# Patient Record
Sex: Female | Born: 1970 | Race: Black or African American | Hispanic: No | Marital: Married | Smoking: Never smoker
Health system: Southern US, Community
[De-identification: ages and names within clinical notes are randomized; demographics above are authoritative.]

## PROBLEM LIST (undated history)

## (undated) DIAGNOSIS — I10 Essential (primary) hypertension: Secondary | ICD-10-CM

---

## 2013-07-03 ENCOUNTER — Encounter (HOSPITAL_COMMUNITY): Payer: Self-pay | Admitting: Emergency Medicine

## 2013-07-03 ENCOUNTER — Emergency Department (HOSPITAL_COMMUNITY): Payer: Self-pay

## 2013-07-03 ENCOUNTER — Emergency Department (HOSPITAL_COMMUNITY)
Admission: EM | Admit: 2013-07-03 | Discharge: 2013-07-03 | Disposition: A | Discharge status: Home / Self Care | Payer: Self-pay | Attending: Emergency Medicine | Admitting: Emergency Medicine

## 2013-07-03 DIAGNOSIS — R000 Tachycardia, unspecified: Secondary | ICD-10-CM | POA: Insufficient documentation

## 2013-07-03 DIAGNOSIS — M2550 Pain in unspecified joint: Secondary | ICD-10-CM | POA: Insufficient documentation

## 2013-07-03 DIAGNOSIS — R52 Pain, unspecified: Secondary | ICD-10-CM | POA: Insufficient documentation

## 2013-07-03 DIAGNOSIS — Z79899 Other long term (current) drug therapy: Secondary | ICD-10-CM | POA: Insufficient documentation

## 2013-07-03 DIAGNOSIS — I10 Essential (primary) hypertension: Secondary | ICD-10-CM | POA: Insufficient documentation

## 2013-07-03 DIAGNOSIS — R509 Fever, unspecified: Secondary | ICD-10-CM

## 2013-07-03 DIAGNOSIS — J159 Unspecified bacterial pneumonia: Secondary | ICD-10-CM | POA: Insufficient documentation

## 2013-07-03 DIAGNOSIS — J189 Pneumonia, unspecified organism: Secondary | ICD-10-CM

## 2013-07-03 DIAGNOSIS — Z3202 Encounter for pregnancy test, result negative: Secondary | ICD-10-CM | POA: Insufficient documentation

## 2013-07-03 HISTORY — DX: Essential (primary) hypertension: I10

## 2013-07-03 LAB — CBC WITH DIFFERENTIAL/PLATELET
BASOS ABS: 0 10*3/uL (ref 0.0–0.1)
Basophils Relative: 0 % (ref 0–1)
EOS PCT: 1 % (ref 0–5)
Eosinophils Absolute: 0.1 10*3/uL (ref 0.0–0.7)
HEMATOCRIT: 41.6 % (ref 36.0–46.0)
HEMOGLOBIN: 13.8 g/dL (ref 12.0–15.0)
Lymphocytes Relative: 11 % — ABNORMAL LOW (ref 12–46)
Lymphs Abs: 0.8 10*3/uL (ref 0.7–4.0)
MCH: 26.6 pg (ref 26.0–34.0)
MCHC: 33.2 g/dL (ref 30.0–36.0)
MCV: 80.2 fL (ref 78.0–100.0)
MONO ABS: 0.3 10*3/uL (ref 0.1–1.0)
MONOS PCT: 5 % (ref 3–12)
Neutro Abs: 6.4 10*3/uL (ref 1.7–7.7)
Neutrophils Relative %: 84 % — ABNORMAL HIGH (ref 43–77)
Platelets: 335 10*3/uL (ref 150–400)
RBC: 5.19 MIL/uL — ABNORMAL HIGH (ref 3.87–5.11)
RDW: 13.8 % (ref 11.5–15.5)
WBC: 7.6 10*3/uL (ref 4.0–10.5)

## 2013-07-03 LAB — BASIC METABOLIC PANEL
BUN: 12 mg/dL (ref 6–23)
CALCIUM: 9.9 mg/dL (ref 8.4–10.5)
CO2: 22 meq/L (ref 19–32)
CREATININE: 0.8 mg/dL (ref 0.50–1.10)
Chloride: 103 mEq/L (ref 96–112)
GFR calc Af Amer: >90 mL/min (ref >90)
GFR calc non Af Amer: 88 mL/min — ABNORMAL LOW (ref >90)
Glucose, Bld: 104 mg/dL — ABNORMAL HIGH (ref 70–99)
Potassium: 4.2 mEq/L (ref 3.7–5.3)
Sodium: 142 mEq/L (ref 137–147)

## 2013-07-03 LAB — URINALYSIS, ROUTINE W REFLEX MICROSCOPIC
BILIRUBIN URINE: NEGATIVE
Glucose, UA: NEGATIVE mg/dL
HGB URINE DIPSTICK: NEGATIVE
Ketones, ur: NEGATIVE mg/dL
Leukocytes, UA: NEGATIVE
Nitrite: NEGATIVE
Protein, ur: NEGATIVE mg/dL
Specific Gravity, Urine: 1.007 (ref 1.005–1.030)
UROBILINOGEN UA: 1 mg/dL (ref 0.0–1.0)
pH: 7.5 (ref 5.0–8.0)

## 2013-07-03 LAB — POCT I-STAT TROPONIN I: TROPONIN I, POC: 0.01 ng/mL (ref 0.00–0.08)

## 2013-07-03 LAB — PREGNANCY, URINE: PREG TEST UR: NEGATIVE

## 2013-07-03 MED ORDER — MORPHINE SULFATE 4 MG/ML IJ SOLN
4.0000 mg | Freq: Once | INTRAMUSCULAR | Status: AC
  Administered 2013-07-03: 4 mg via INTRAVENOUS
  Filled 2013-07-03: qty 1

## 2013-07-03 MED ORDER — BENZONATATE 100 MG PO CAPS: 100.0000 mg | ORAL_CAPSULE | Freq: Three times a day (TID) | ORAL | Status: AC

## 2013-07-03 MED ORDER — HYDROCODONE-ACETAMINOPHEN 5-325 MG PO TABS
1.0000 | ORAL_TABLET | Freq: Once | ORAL | Status: AC
  Administered 2013-07-03: 1 via ORAL
  Filled 2013-07-03: qty 1

## 2013-07-03 MED ORDER — ALBUTEROL SULFATE HFA 108 (90 BASE) MCG/ACT IN AERS
1.0000 | INHALATION_SPRAY | Freq: Four times a day (QID) | RESPIRATORY_TRACT | Status: AC | PRN
Start: 2013-07-03 — End: ?

## 2013-07-03 MED ORDER — GUAIFENESIN 100 MG/5ML PO SOLN
5.0000 mL | Freq: Once | ORAL | Status: AC
  Administered 2013-07-03: 100 mg via ORAL
  Filled 2013-07-03: qty 5

## 2013-07-03 MED ORDER — ACETAMINOPHEN 500 MG PO TABS
1000.0000 mg | ORAL_TABLET | Freq: Once | ORAL | Status: AC
  Administered 2013-07-03: 1000 mg via ORAL
  Filled 2013-07-03: qty 2

## 2013-07-03 MED ORDER — HYDROCODONE-ACETAMINOPHEN 5-325 MG PO TABS
1.0000 | ORAL_TABLET | Freq: Four times a day (QID) | ORAL | Status: AC | PRN
Start: 2013-07-03 — End: ?

## 2013-07-03 MED ORDER — AZITHROMYCIN 250 MG PO TABS: 250.0000 mg | ORAL_TABLET | Freq: Every day | ORAL | Status: AC

## 2013-07-03 MED ORDER — SODIUM CHLORIDE 0.9 % IV BOLUS (SEPSIS)
2000.0000 mL | Freq: Once | INTRAVENOUS | Status: AC
  Administered 2013-07-03: 2000 mL via INTRAVENOUS

## 2013-07-03 NOTE — ED Notes (Signed)
Patient ate a [place] sandwich meal and drank a ginger ale. Tolerated both. States she feels better.

## 2013-07-03 NOTE — ED Notes (Signed)
Pt presents to department for evaluation chest pain, cough, and soreness to entire body. Onset x1 day ago. Pt crying in triage. Respirations unlabored. Pt is alert and oriented x4.

## 2013-07-03 NOTE — ED Notes (Signed)
Patient transported to X-ray 

## 2013-07-03 NOTE — ED Provider Notes (Signed)
CSN: [id number]     Arrival date & time 07/03/13  1240 History   First MD Initiated Contact with Patient 07/03/13 1458     Chief Complaint  Patient presents with  . Chest Pain  . Cough  . Shortness of Breath   (Consider location/radiation/quality/duration/timing/severity/associated sxs/prior Treatment) HPI Comments: 43 yo AA female presents with cc of cough, "full body aches", and weakness.  Onset 1 day ago.  Pt has sick contacts at work.  Pt denies recent travel.    Pt tried otc robitussin with no help.  NKDA Meds: robitussin, one high blood pressure Rx PMH: - HTN PSH: TAH SHx: no smoking, drinking or drug use  Patient is a 43 y.o. female presenting with chest pain, cough, and shortness of breath. The history is provided by the patient.  Chest Pain Pain location:  Unable to specify (diffuse with cough only) Pain quality: aching   Pain radiates to:  Does not radiate Pain radiates to the back: no   Pain severity:  Moderate Onset quality:  Gradual Duration:  1 day Timing:  Intermittent Relieved by:  None tried Worsened by:  Nothing tried Ineffective treatments: robitussin. Associated symptoms: cough, fatigue, fever and shortness of breath   Associated symptoms: no diaphoresis, no dysphagia, no headache, no heartburn and no palpitations   Associated symptoms comment:  Subjective fever Cough:    Cough characteristics:  Productive   Sputum characteristics:  Nondescript   Severity:  Moderate   Onset quality:  Gradual   Duration:  1 day   Progression:  Waxing and waning   Chronicity:  New Cough Associated symptoms: chest pain, fever and shortness of breath   Associated symptoms: no chills, no diaphoresis, no headaches and no wheezing   Shortness of Breath Associated symptoms: chest pain, cough and fever   Associated symptoms: no diaphoresis, no headaches, no neck pain and no wheezing     Past Medical History  Diagnosis Date  . Hypertension    History reviewed. No  pertinent past surgical history. No family history on file. History  Substance Use Topics  . Smoking status: Never Smoker   . Smokeless tobacco: Not on file  . Alcohol Use: No   OB History   Grav Para Term Preterm Abortions TAB SAB Ect Mult Living                 Review of Systems  Constitutional: Positive for fever and fatigue. Negative for chills, diaphoresis, activity change, appetite change and unexpected weight change.  HENT: Negative.  Negative for trouble swallowing.   Eyes: Negative.   Respiratory: Positive for cough and shortness of breath. Negative for apnea, choking, chest tightness, wheezing and stridor.   Cardiovascular: Positive for chest pain. Negative for palpitations and leg swelling.  Gastrointestinal: Negative.  Negative for heartburn.  Endocrine: Negative.   Genitourinary: Negative.   Musculoskeletal: Positive for arthralgias. Negative for neck pain and neck stiffness.       Myalgias  Skin: Negative.   Neurological: Negative for headaches.    Allergies  Review of patient's allergies indicates no known allergies.  Home Medications   Current Outpatient Rx  Name  Route  Sig  Dispense  Refill  . guaiFENesin (ROBITUSSIN) 100 MG/5ML liquid   Oral   Take 200 mg by mouth 3 (three) times daily as needed for cough.         . hydrochlorothiazide (HYDRODIURIL) 25 MG tablet   Oral   Take 25 mg by mouth daily.         Marland Kitchen  albuterol (PROVENTIL HFA;VENTOLIN HFA) 108 (90 BASE) MCG/ACT inhaler   Inhalation   Inhale 1-2 puffs into the lungs every 6 (six) hours as needed for wheezing or shortness of breath.   1 Inhaler   0   . azithromycin (ZITHROMAX) 250 MG tablet   Oral   Take 1 tablet (250 mg total) by mouth daily. Take first 2 tablets together, then 1 every day until finished.   6 tablet   0   . benzonatate (TESSALON) 100 MG capsule   Oral   Take 1 capsule (100 mg total) by mouth every 8 (eight) hours.   21 capsule   0   . HYDROcodone-acetaminophen  (NORCO/VICODIN) 5-325 MG per tablet   Oral   Take 1 tablet by mouth every 6 (six) hours as needed.   6 tablet   0    BP 114/67  Pulse 113  Temp(Src) 99.1 F (37.3 C) (Oral)  Resp 16  SpO2 98% Physical Exam  Nursing note and vitals reviewed. Constitutional: She is oriented to person, place, and time. She appears well-developed and well-nourished.  Patient is laying in the fetal position, very soft-spoken, but alert and oriented and in no acute distress.  HENT:  Head: Normocephalic and atraumatic.  Right Ear: External ear normal.  Left Ear: External ear normal.  Nose: Nose normal.  Mouth/Throat: Oropharynx is clear and moist.  Eyes: Conjunctivae are normal. Right eye exhibits no discharge. Left eye exhibits no discharge.  Neck: Normal range of motion. Neck supple. No JVD present.  No nuchal rigidity, no meningismus, full active range of motion.  Cardiovascular: Regular rhythm, normal heart sounds and intact distal pulses.  Exam reveals no friction rub.   No murmur heard. Tachycardic in 120s.  Pulmonary/Chest: Effort normal. No stridor. No respiratory distress. She has no wheezes. She has no rales. She exhibits no tenderness.  Abdominal: Soft. Bowel sounds are normal. She exhibits no distension and no mass. There is no tenderness. There is no rebound and no guarding.  Musculoskeletal: Normal range of motion. She exhibits no edema and no tenderness.  Neurological: She is alert and oriented to person, place, and time. She has normal reflexes. No cranial nerve deficit. She exhibits normal muscle tone.  Skin: Skin is warm and dry.    ED Course  Procedures (including critical care time) Labs Review Labs Reviewed  CBC WITH DIFFERENTIAL - Abnormal; Notable for the following:    RBC 5.19 (*)    Neutrophils Relative % 84 (*)    Lymphocytes Relative 11 (*)    All other components within normal limits  BASIC METABOLIC PANEL - Abnormal; Notable for the following:    Glucose, Bld 104  (*)    GFR calc non Af Amer 88 (*)    All other components within normal limits  URINALYSIS, ROUTINE W REFLEX MICROSCOPIC  PREGNANCY, URINE  POCT I-STAT TROPONIN I   Imaging Review Dg Chest 2 View  07/03/2013   CLINICAL DATA:  Cough and congestion for 1 day. History of hypertension. Ex-smoker.  EXAM: CHEST  2 VIEW  COMPARISON:  None.  FINDINGS: The heart size and mediastinal contours are normal. There is mild atelectasis at both lung bases. No edema, confluent airspace opacity or significant pleural effusion is seen. There is occult spinal dysraphism at T1. No acute osseous findings are evident.  IMPRESSION: Mild bibasilar atelectasis.  No edema or confluent airspace opacity.   Electronically Signed   By: Roxy Horseman M.D.   On: 07/03/2013 16:18  EKG Interpretation    Date/Time:  Sunday July 03 2013 12:45:01 EST Ventricular Rate:  133 PR Interval:  150 QRS Duration: 70 QT Interval:  286 QTC Calculation: 425 R Axis:   38 Text Interpretation:  Sinus tachycardia Otherwise normal ECG Confirmed by MASNERI  MD, DAVID ([id number]) on 07/03/2013 3:06:47 PM            MDM   1. Fever   2. Community acquired pneumonia    43 year old African American female presents emergency department with one day history of cough, diffuse myalgias, chest pain only with cough, and generalized malaise. Patient is otherwise healthy. She states she has had sick contacts at work, but denies recent travel.  Triage initiated protocol reveals CBC, BMP, trop which are unremarkable. EKG reveals only sinus tachycardia.  8:21 PM ER course: Prolonged ER stay awaiting IV fluids and reassessment.  Patient subjectively and objectively feeling better. Heart rate improved. She feels much better sitting up, alert and oriented, no distress, ate a sandwich, and is ready to go home. Chest x-ray read as negative by radiology however I think there may be consolidation in the right hemithorax. Urinalysis negative, pregnancy  negative, troponin negative, CBC negative, David metabolic panel negative.  Plan to discharge with symptomatic treatment patient should take Tylenol and Motrin, pain medication given for assistance with cough and for severe pain, inhaler, antibiotics, and Tessalon Perles. She should followup with her primary care provider within the next few days. ER precautions were given. The patient and her husband agree with plan.    Darlys Galesavid Masneri, MD 07/03/13 2024

## 2013-07-03 NOTE — ED Notes (Signed)
Patient returned from X-ray 

## 2013-07-03 NOTE — Discharge Instructions (Signed)
Fever, Adult A fever is a higher than normal body temperature. In an adult, an oral temperature around 98.6 F (37 C) is considered normal. A temperature of 100.4 F (38 C) or higher is generally considered a fever. Mild or moderate fevers generally have no long-term effects and often do not require treatment. Extreme fever (greater than or equal to 106 F or 41.1 C) can cause seizures. The sweating that may occur with repeated or prolonged fever may cause dehydration. Elderly people can develop confusion during a fever. A measured temperature can vary with:  Age.  Time of day.  Method of measurement (mouth, underarm, rectal, or ear). The fever is confirmed by taking a temperature with a thermometer. Temperatures can be taken different ways. Some methods are accurate and some are not.  An oral temperature is used most commonly. Electronic thermometers are fast and accurate.  An ear temperature will only be accurate if the thermometer is positioned as recommended by the manufacturer.  A rectal temperature is accurate and done for those adults who have a condition where an oral temperature cannot be taken.  An underarm (axillary) temperature is not accurate and not recommended. Fever is a symptom, not a disease.  CAUSES   Infections commonly cause fever.  Some noninfectious causes for fever include:  Some arthritis conditions.  Some thyroid or adrenal gland conditions.  Some immune system conditions.  Some types of cancer.  A medicine reaction.  High doses of certain street drugs such as methamphetamine.  Dehydration.  Exposure to high outside or room temperatures.  Occasionally, the source of a fever cannot be determined. This is sometimes called a "fever of unknown origin" (FUO).  Some situations may lead to a temporary rise in body temperature that may go away on its own. Examples are:  Childbirth.  Surgery.  Intense exercise. HOME CARE INSTRUCTIONS   Take  appropriate medicines for fever. Follow dosing instructions carefully. If you use acetaminophen to reduce the fever, be careful to avoid taking other medicines that also contain acetaminophen. Do not take aspirin for a fever if you are younger than age 59. There is an association with Reye's syndrome. Reye's syndrome is a rare but potentially deadly disease.  If an infection is present and antibiotics have been prescribed, take them as directed. Finish them even if you start to feel better.  Rest as needed.  Maintain an adequate fluid intake. To prevent dehydration during an illness with prolonged or recurrent fever, you may need to drink extra fluid.Drink enough fluids to keep your urine clear or pale yellow.  Sponging or bathing with room temperature water may help reduce body temperature. Do not use ice water or alcohol sponge baths.  Dress comfortably, but do not over-bundle. SEEK MEDICAL CARE IF:   You are unable to keep fluids down.  You develop vomiting or diarrhea.  You are not feeling at least partly better after 3 days.  You develop new symptoms or problems. SEEK IMMEDIATE MEDICAL CARE IF:   You have shortness of breath or trouble breathing.  You develop excessive weakness.  You are dizzy or you faint.  You are extremely thirsty or you are making little or no urine.  You develop new pain that was not there before (such as in the head, neck, chest, back, or abdomen).  You have persistant vomiting and diarrhea for more than 1 to 2 days.  You develop a stiff neck or your eyes become sensitive to light.  You develop a  skin rash.  You have a fever or persistent symptoms for more than 2 to 3 days.  You have a fever and your symptoms suddenly get worse. MAKE SURE YOU:   Understand these instructions.  Will watch your condition.  Will get help right away if you are not doing well or get worse. Document Released: 11/05/2000 Document Revised: 08/04/2011 Document  Reviewed: 03/13/2011 [facility]Exit[facility] Patient Information 2014 [place], [place].  Pneumonia, Adult Pneumonia is an infection of the lungs. It may be caused by a germ (virus or bacteria). Some types of pneumonia can spread easily from person to person. This can happen when you cough or sneeze. HOME CARE  Only take medicine as told by your doctor.  Take your medicine (antibiotics) as told. Finish it even if you start to feel better.  Do not smoke.  You may use a vaporizer or humidifier in your room. This can help loosen thick spit (mucus).  Sleep so you are almost sitting up (semi-upright). This helps reduce coughing.  Rest. A shot (vaccine) can help prevent pneumonia. Shots are often advised for:  People over 423 years old.  Patients on chemotherapy.  People with long-term (chronic) lung problems.  People with immune system problems. GET HELP RIGHT AWAY IF:   You are getting worse.  You cannot control your cough, and you are losing sleep.  You cough up blood.  Your pain gets worse, even with medicine.  You have a fever.  Any of your problems are getting worse, not better.  You have shortness of breath or chest pain. MAKE SURE YOU:   Understand these instructions.  Will watch your condition.  Will get help right away if you are not doing well or get worse. Document Released: 10/29/2007 Document Revised: 08/04/2011 Document Reviewed: 08/02/2010 [facility]Exit[facility] Patient Information 2014 [place], [place].

## 2014-08-14 IMAGING — CR DG CHEST 2V
2 series · 2 of 2 positions shown · non-contrast
Comparison: None.

CLINICAL DATA: Cough and congestion for 1 day. History of
hypertension. Ex-smoker.

EXAM:
CHEST  2 VIEW

[w chest lat]
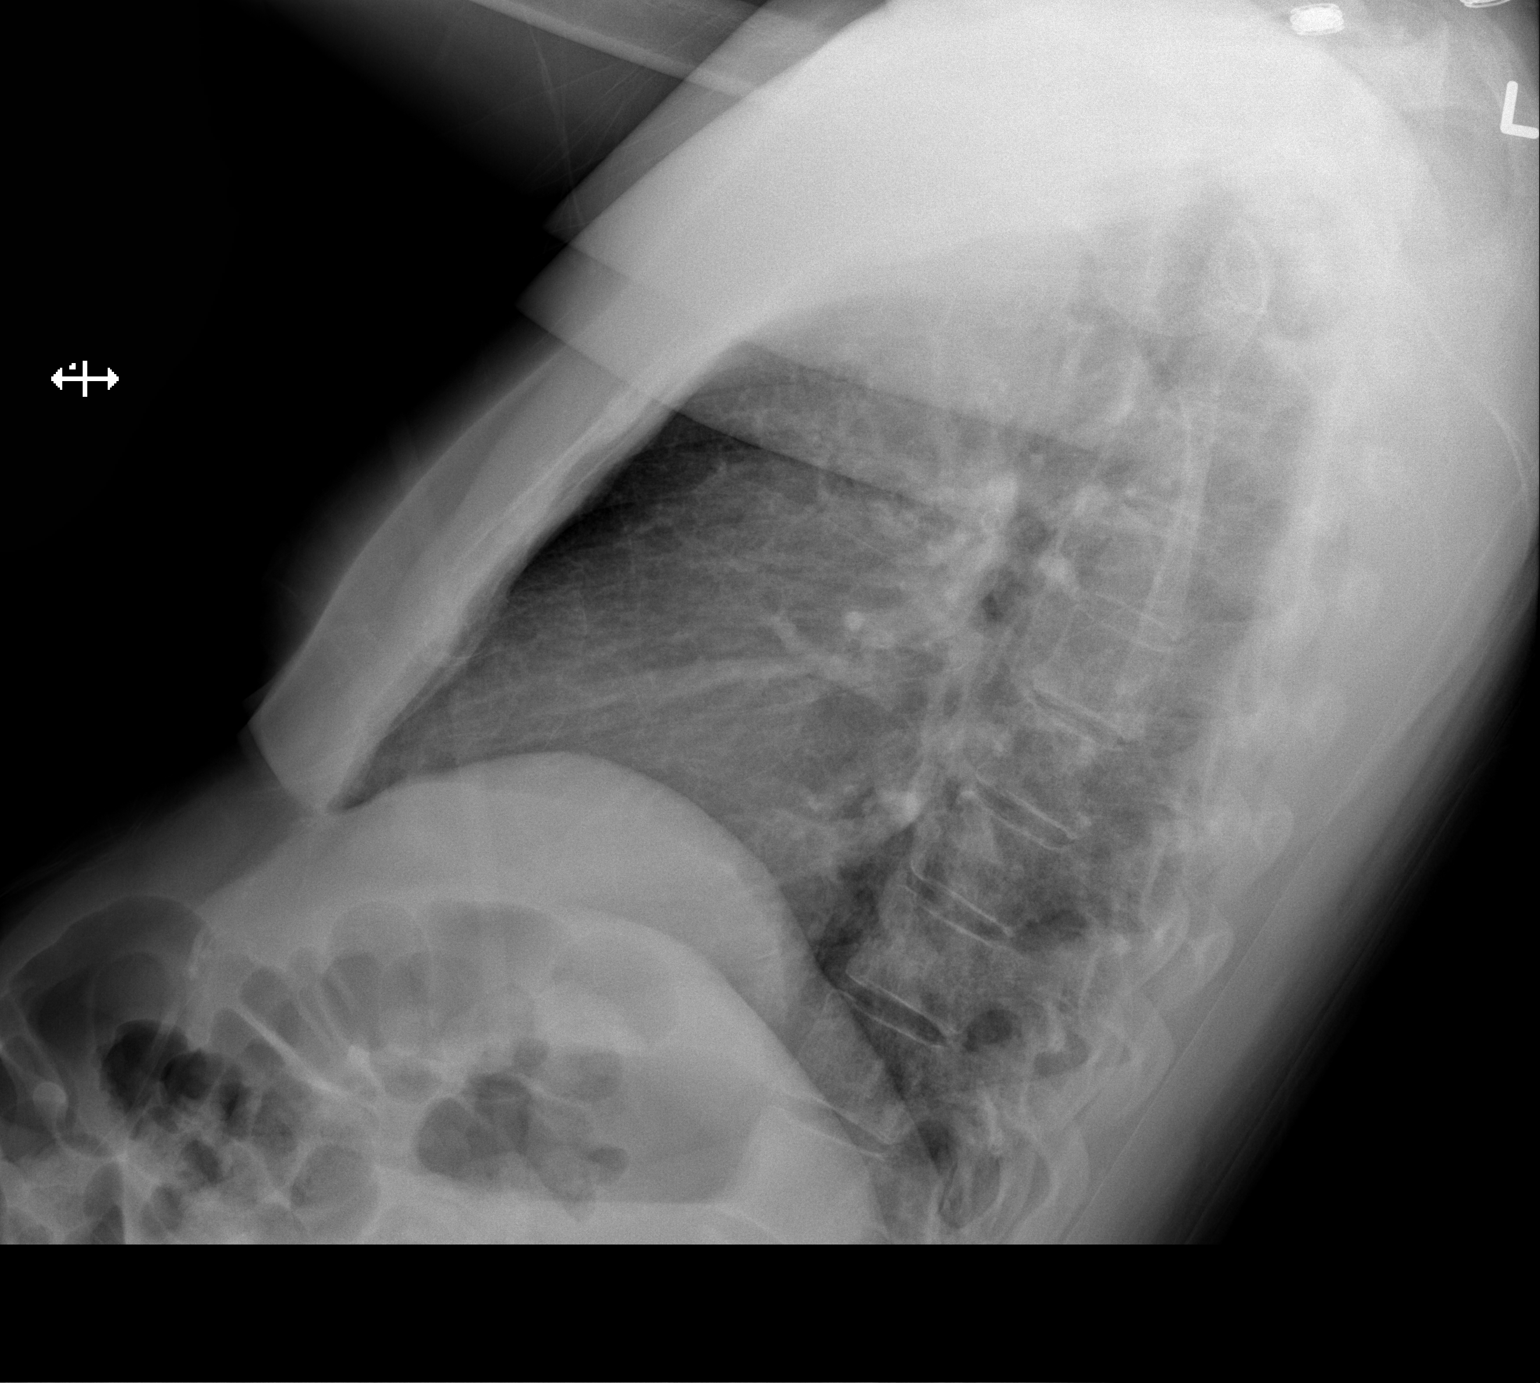

[x chest ap]
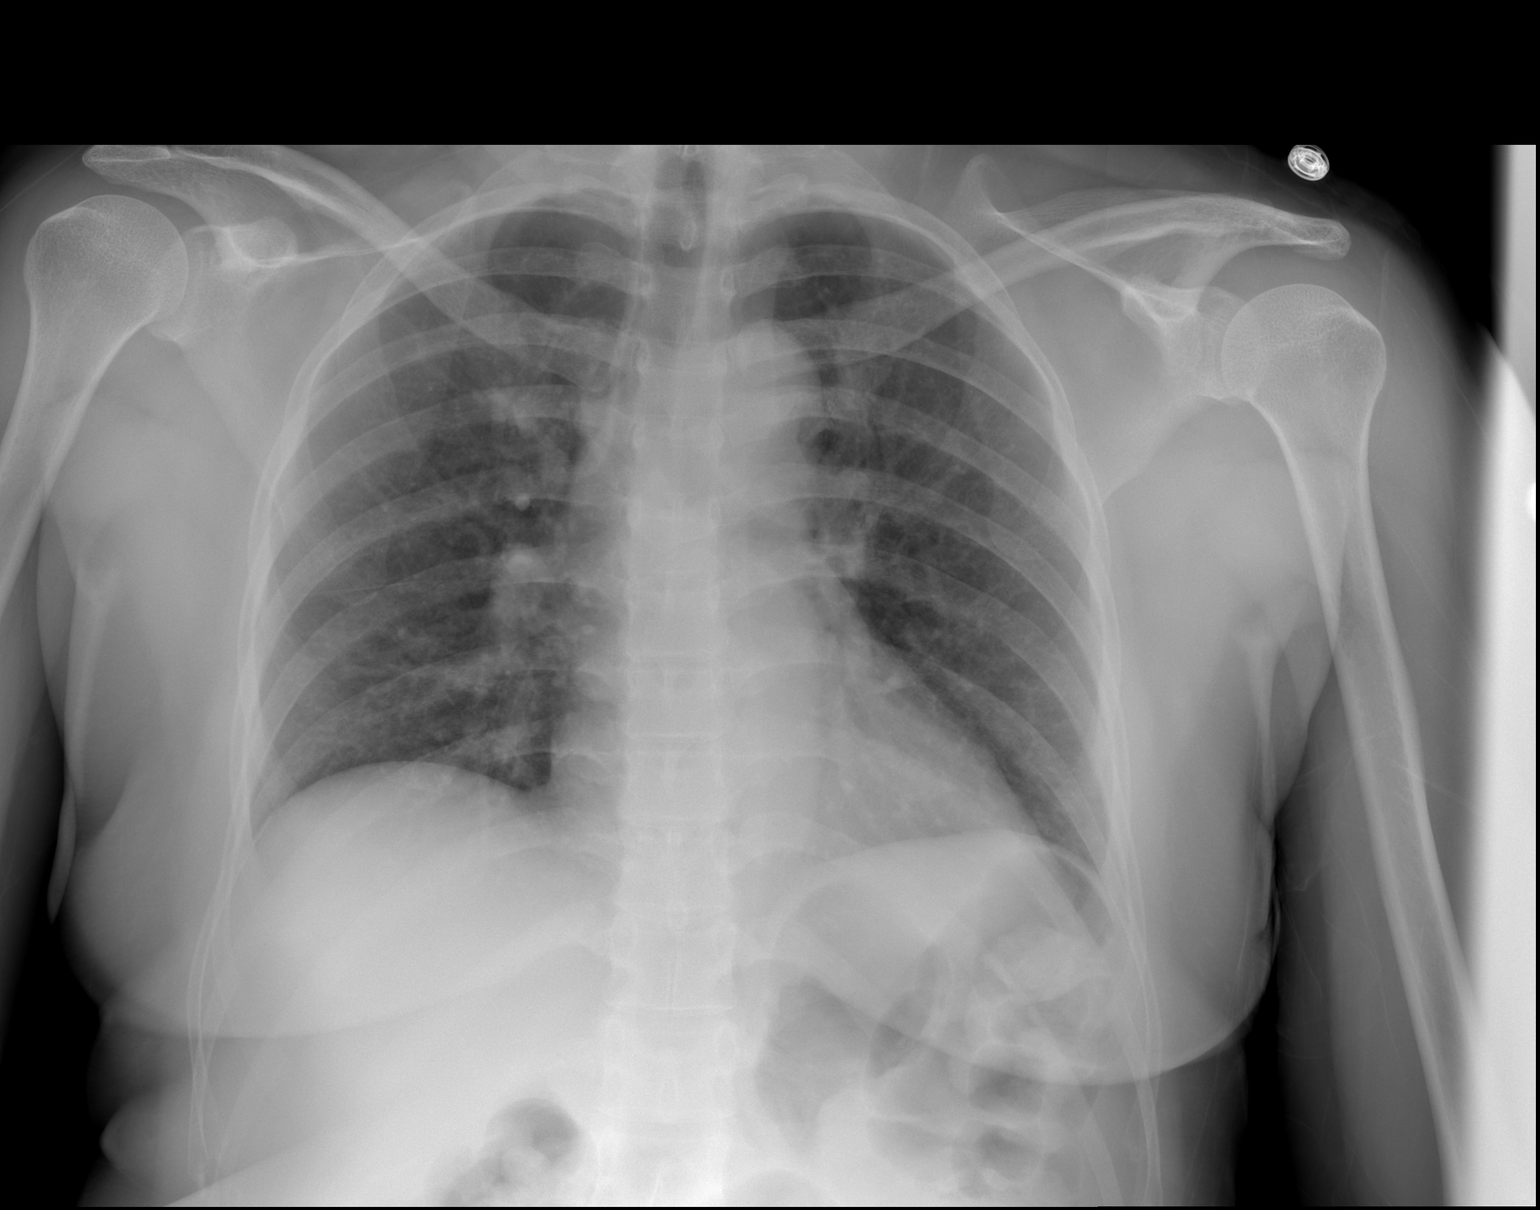

[2 of 2 positions shown; findings below may reference images not displayed]

FINDINGS: The heart size and mediastinal contours are normal. There is mild
atelectasis at both lung bases. No edema, confluent airspace opacity
or significant pleural effusion is seen. There is occult spinal
dysraphism at T1. No acute osseous findings are evident.
IMPRESSION: Mild bibasilar atelectasis.  No edema or confluent airspace opacity.
